# Patient Record
Sex: Male | Born: 1960 | Race: White | Hispanic: No | Marital: Married
Health system: Southern US, Community
[De-identification: ages and names within clinical notes are randomized; demographics above are authoritative.]

## PROBLEM LIST (undated history)

## (undated) DIAGNOSIS — E119 Type 2 diabetes mellitus without complications: Secondary | ICD-10-CM

## (undated) DIAGNOSIS — I10 Essential (primary) hypertension: Secondary | ICD-10-CM

## (undated) DIAGNOSIS — Z8619 Personal history of other infectious and parasitic diseases: Secondary | ICD-10-CM

## (undated) HISTORY — PX: HERNIA REPAIR: SHX51

---

## 2011-09-27 DIAGNOSIS — Z8619 Personal history of other infectious and parasitic diseases: Secondary | ICD-10-CM

## 2011-09-27 HISTORY — DX: Personal history of other infectious and parasitic diseases: Z86.19

## 2013-10-06 ENCOUNTER — Inpatient Hospital Stay (HOSPITAL_COMMUNITY)
Admission: EM | Admit: 2013-10-06 | Discharge: 2013-10-27 | DRG: 003 | Disposition: E | Payer: 59 | Attending: Internal Medicine | Admitting: Internal Medicine

## 2013-10-06 ENCOUNTER — Emergency Department (HOSPITAL_COMMUNITY): Payer: 59

## 2013-10-06 ENCOUNTER — Ambulatory Visit (HOSPITAL_COMMUNITY): Admit: 2013-10-06 | Payer: Self-pay | Admitting: Cardiology

## 2013-10-06 ENCOUNTER — Encounter (HOSPITAL_COMMUNITY): Admission: EM | Disposition: E | Payer: 59 | Source: Home / Self Care | Attending: Internal Medicine

## 2013-10-06 DIAGNOSIS — G4733 Obstructive sleep apnea (adult) (pediatric): Secondary | ICD-10-CM | POA: Diagnosis present

## 2013-10-06 DIAGNOSIS — I10 Essential (primary) hypertension: Secondary | ICD-10-CM | POA: Diagnosis present

## 2013-10-06 DIAGNOSIS — E662 Morbid (severe) obesity with alveolar hypoventilation: Secondary | ICD-10-CM | POA: Diagnosis present

## 2013-10-06 DIAGNOSIS — I469 Cardiac arrest, cause unspecified: Secondary | ICD-10-CM | POA: Diagnosis present

## 2013-10-06 DIAGNOSIS — E875 Hyperkalemia: Secondary | ICD-10-CM

## 2013-10-06 DIAGNOSIS — I4901 Ventricular fibrillation: Principal | ICD-10-CM | POA: Diagnosis present

## 2013-10-06 DIAGNOSIS — Z87891 Personal history of nicotine dependence: Secondary | ICD-10-CM

## 2013-10-06 DIAGNOSIS — R570 Cardiogenic shock: Secondary | ICD-10-CM | POA: Diagnosis present

## 2013-10-06 DIAGNOSIS — I501 Left ventricular failure: Secondary | ICD-10-CM | POA: Diagnosis present

## 2013-10-06 DIAGNOSIS — K029 Dental caries, unspecified: Secondary | ICD-10-CM | POA: Diagnosis present

## 2013-10-06 DIAGNOSIS — I213 ST elevation (STEMI) myocardial infarction of unspecified site: Secondary | ICD-10-CM

## 2013-10-06 DIAGNOSIS — E874 Mixed disorder of acid-base balance: Secondary | ICD-10-CM | POA: Diagnosis present

## 2013-10-06 DIAGNOSIS — I2510 Atherosclerotic heart disease of native coronary artery without angina pectoris: Secondary | ICD-10-CM | POA: Diagnosis present

## 2013-10-06 DIAGNOSIS — I498 Other specified cardiac arrhythmias: Secondary | ICD-10-CM | POA: Diagnosis present

## 2013-10-06 DIAGNOSIS — D638 Anemia in other chronic diseases classified elsewhere: Secondary | ICD-10-CM | POA: Diagnosis present

## 2013-10-06 DIAGNOSIS — E119 Type 2 diabetes mellitus without complications: Secondary | ICD-10-CM | POA: Diagnosis present

## 2013-10-06 DIAGNOSIS — I2109 ST elevation (STEMI) myocardial infarction involving other coronary artery of anterior wall: Secondary | ICD-10-CM | POA: Diagnosis present

## 2013-10-06 DIAGNOSIS — J9600 Acute respiratory failure, unspecified whether with hypoxia or hypercapnia: Secondary | ICD-10-CM | POA: Diagnosis present

## 2013-10-06 DIAGNOSIS — N19 Unspecified kidney failure: Secondary | ICD-10-CM

## 2013-10-06 DIAGNOSIS — Z6841 Body Mass Index (BMI) 40.0 and over, adult: Secondary | ICD-10-CM

## 2013-10-06 HISTORY — DX: Type 2 diabetes mellitus without complications: E11.9

## 2013-10-06 HISTORY — DX: Essential (primary) hypertension: I10

## 2013-10-06 HISTORY — PX: LEFT HEART CATHETERIZATION WITH CORONARY ANGIOGRAM: SHX5451

## 2013-10-06 HISTORY — DX: Personal history of other infectious and parasitic diseases: Z86.19

## 2013-10-06 LAB — COMPREHENSIVE METABOLIC PANEL
ALK PHOS: 116 U/L (ref 39–117)
ALT: 248 U/L — ABNORMAL HIGH (ref 0–53)
AST: 256 U/L — ABNORMAL HIGH (ref 0–37)
Albumin: 3 g/dL — ABNORMAL LOW (ref 3.5–5.2)
BUN: 78 mg/dL — AB (ref 6–23)
CHLORIDE: 102 meq/L (ref 96–112)
CO2: 12 meq/L — AB (ref 19–32)
CREATININE: 4.49 mg/dL — AB (ref 0.50–1.35)
Calcium: 9.6 mg/dL (ref 8.4–10.5)
GFR calc Af Amer: 16 mL/min — ABNORMAL LOW (ref >90)
GFR calc non Af Amer: 14 mL/min — ABNORMAL LOW (ref >90)
Glucose, Bld: 272 mg/dL — ABNORMAL HIGH (ref 70–99)
Potassium: 5.9 mEq/L — ABNORMAL HIGH (ref 3.7–5.3)
Sodium: 138 mEq/L (ref 137–147)
Total Protein: 6.5 g/dL (ref 6.0–8.3)

## 2013-10-06 LAB — CBC
HEMATOCRIT: 25.4 % — AB (ref 39.0–52.0)
Hemoglobin: 8.3 g/dL — ABNORMAL LOW (ref 13.0–17.0)
MCH: 30.4 pg (ref 26.0–34.0)
MCHC: 32.7 g/dL (ref 30.0–36.0)
MCV: 93 fL (ref 78.0–100.0)
Platelets: 286 10*3/uL (ref 150–400)
RBC: 2.73 MIL/uL — AB (ref 4.22–5.81)
RDW: 13.6 % (ref 11.5–15.5)
WBC: 20.1 10*3/uL — AB (ref 4.0–10.5)

## 2013-10-06 LAB — URINE MICROSCOPIC-ADD ON

## 2013-10-06 LAB — PROTIME-INR
INR: 1.69 — AB (ref 0.00–1.49)
PROTHROMBIN TIME: 19.4 s — AB (ref 11.6–15.2)

## 2013-10-06 LAB — POCT I-STAT, CHEM 8
BUN: 102 mg/dL — ABNORMAL HIGH (ref 6–23)
Calcium, Ion: 1.38 mmol/L — ABNORMAL HIGH (ref 1.12–1.23)
Chloride: 113 mEq/L — ABNORMAL HIGH (ref 96–112)
Creatinine, Ser: 4.9 mg/dL — ABNORMAL HIGH (ref 0.50–1.35)
GLUCOSE: 259 mg/dL — AB (ref 70–99)
HCT: 25 % — ABNORMAL LOW (ref 39.0–52.0)
Hemoglobin: 8.5 g/dL — ABNORMAL LOW (ref 13.0–17.0)
Potassium: 5.8 mEq/L — ABNORMAL HIGH (ref 3.7–5.3)
Sodium: 141 mEq/L (ref 137–147)
TCO2: 15 mmol/L (ref 0–100)

## 2013-10-06 LAB — POCT I-STAT TROPONIN I: Troponin i, poc: 0.56 ng/mL (ref 0.00–0.08)

## 2013-10-06 LAB — URINALYSIS, ROUTINE W REFLEX MICROSCOPIC
BILIRUBIN URINE: NEGATIVE
GLUCOSE, UA: 100 mg/dL — AB
Ketones, ur: NEGATIVE mg/dL
LEUKOCYTES UA: NEGATIVE
Nitrite: NEGATIVE
Specific Gravity, Urine: 1.014 (ref 1.005–1.030)
Urobilinogen, UA: 0.2 mg/dL (ref 0.0–1.0)
pH: 5 (ref 5.0–8.0)

## 2013-10-06 LAB — TROPONIN I: Troponin I: 0.57 ng/mL (ref <0.30)

## 2013-10-06 LAB — CG4 I-STAT (LACTIC ACID): Lactic Acid, Venous: 9.55 mmol/L — ABNORMAL HIGH (ref 0.5–2.2)

## 2013-10-06 SURGERY — LEFT HEART CATHETERIZATION WITH CORONARY ANGIOGRAM
Anesthesia: LOCAL

## 2013-10-06 MED ORDER — HEPARIN (PORCINE) IN NACL 2-0.9 UNIT/ML-% IJ SOLN
INTRAMUSCULAR | Status: AC
  Filled 2013-10-06: qty 1000

## 2013-10-06 MED ORDER — NOREPINEPHRINE BITARTRATE 1 MG/ML IJ SOLN
0.5000 ug/min | INTRAMUSCULAR | Status: DC
  Administered 2013-10-06: 5 ug/min via INTRAVENOUS
  Filled 2013-10-06: qty 4

## 2013-10-06 MED ORDER — BIVALIRUDIN 250 MG IV SOLR
INTRAVENOUS | Status: AC
  Filled 2013-10-06: qty 250

## 2013-10-06 MED ORDER — SODIUM CHLORIDE 0.9 % IV SOLN
1.0000 g | Freq: Once | INTRAVENOUS | Status: DC
  Filled 2013-10-06: qty 10

## 2013-10-06 MED ORDER — DEXTROSE 5 % IV SOLN
300.0000 mg | INTRAVENOUS | Status: AC | PRN
  Administered 2013-10-06: 300 mg via INTRAVENOUS

## 2013-10-06 MED ORDER — HEPARIN SODIUM (PORCINE) 5000 UNIT/ML IJ SOLN
INTRAMUSCULAR | Status: AC
  Filled 2013-10-06: qty 1

## 2013-10-06 MED ORDER — ETOMIDATE 2 MG/ML IV SOLN
INTRAVENOUS | Status: AC
  Filled 2013-10-06: qty 20

## 2013-10-06 MED ORDER — SODIUM BICARBONATE 8.4 % IV SOLN
50.0000 meq | Freq: Once | INTRAVENOUS | Status: AC
  Administered 2013-10-07: 50 meq via INTRAVENOUS

## 2013-10-06 MED ORDER — NOREPINEPHRINE BITARTRATE 1 MG/ML IJ SOLN
INTRAMUSCULAR | Status: AC | PRN
  Administered 2013-10-06: 2.093 ug/kg/min via INTRAVENOUS

## 2013-10-06 MED ORDER — LIDOCAINE HCL (PF) 1 % IJ SOLN
INTRAMUSCULAR | Status: AC
  Filled 2013-10-06: qty 30

## 2013-10-06 MED ORDER — EPINEPHRINE HCL 0.1 MG/ML IJ SOSY
PREFILLED_SYRINGE | INTRAMUSCULAR | Status: AC | PRN
  Administered 2013-10-06 (×8): 1 mg via INTRAVENOUS

## 2013-10-06 MED ORDER — LIDOCAINE HCL (CARDIAC) 20 MG/ML IV SOLN
INTRAVENOUS | Status: AC
  Filled 2013-10-06: qty 5

## 2013-10-06 MED ORDER — DEXTROSE 50 % IV SOLN: 50.0000 mL | Freq: Once | INTRAVENOUS | Status: DC

## 2013-10-06 MED ORDER — EPINEPHRINE HCL 1 MG/ML IJ SOLN
0.5000 ug/min | INTRAVENOUS | Status: DC
  Administered 2013-10-07: 0.267 ug/min via INTRAVENOUS
  Filled 2013-10-06: qty 4

## 2013-10-06 MED ORDER — NITROGLYCERIN 0.2 MG/ML ON CALL CATH LAB
INTRAVENOUS | Status: AC
  Filled 2013-10-06: qty 1

## 2013-10-06 MED ORDER — SUCCINYLCHOLINE CHLORIDE 20 MG/ML IJ SOLN
INTRAMUSCULAR | Status: DC
Start: 2013-10-06 — End: 2013-10-07
  Filled 2013-10-06: qty 1

## 2013-10-06 MED ORDER — ROCURONIUM BROMIDE 50 MG/5ML IV SOLN
INTRAVENOUS | Status: AC
  Filled 2013-10-06: qty 2

## 2013-10-06 MED ORDER — SODIUM CHLORIDE 0.9 % IV SOLN
1.0000 ug/kg/min | INTRAVENOUS | Status: DC
  Administered 2013-10-07: 1 ug/kg/min via INTRAVENOUS
  Filled 2013-10-06: qty 20

## 2013-10-06 MED ORDER — EPINEPHRINE HCL 0.1 MG/ML IJ SOSY
PREFILLED_SYRINGE | INTRAMUSCULAR | Status: AC
  Filled 2013-10-06: qty 10

## 2013-10-06 MED ORDER — PHENYLEPHRINE HCL 10 MG/ML IJ SOLN
INTRAMUSCULAR | Status: AC
  Filled 2013-10-06: qty 1

## 2013-10-06 MED ORDER — ASPIRIN 600 MG RE SUPP: 600.0000 mg | Freq: Once | RECTAL | Status: DC

## 2013-10-06 MED ORDER — INSULIN ASPART 100 UNIT/ML ~~LOC~~ SOLN: 8.0000 [IU] | Freq: Once | SUBCUTANEOUS | Status: DC

## 2013-10-06 MED ORDER — CALCIUM CHLORIDE 10 % IV SOLN
INTRAVENOUS | Status: AC | PRN
  Administered 2013-10-06 (×2): 1 g via INTRAVENOUS

## 2013-10-06 MED ORDER — SODIUM BICARBONATE 8.4 % IV SOLN
INTRAVENOUS | Status: AC | PRN
  Administered 2013-10-06 (×3): 50 meq via INTRAVENOUS

## 2013-10-06 MED ORDER — SODIUM CHLORIDE 0.9 % IV SOLN
25.0000 ug/h | INTRAVENOUS | Status: DC
  Administered 2013-10-06: 25 ug/h via INTRAVENOUS
  Filled 2013-10-06: qty 50

## 2013-10-06 MED ORDER — SODIUM CHLORIDE 0.9 % IV SOLN
1.0000 mg/h | INTRAVENOUS | Status: DC
  Administered 2013-10-06: 1 mg/h via INTRAVENOUS
  Filled 2013-10-06: qty 10

## 2013-10-06 MED ORDER — ATROPINE SULFATE 1 MG/ML IJ SOLN
INTRAMUSCULAR | Status: AC | PRN
  Administered 2013-10-06 (×3): 1 mg via INTRAVENOUS

## 2013-10-06 MED ORDER — SODIUM CHLORIDE 0.9 % IV SOLN: 2000.0000 mL | Freq: Once | INTRAVENOUS | Status: DC

## 2013-10-06 NOTE — ED Notes (Signed)
Dr. Denton Lank into room

## 2013-10-06 NOTE — ED Notes (Signed)
Dr. Gordy Levan into room.

## 2013-10-06 NOTE — Code Documentation (Signed)
Levophed increased to 78mcc/min per Dr. Sharyn Lull (card) at [facility]. Dr. Jacklynn Bue (anesth) at [facility].

## 2013-10-06 NOTE — Code Documentation (Signed)
Dr. Sharyn Lull at [facility], heparin 4000 units ordered.

## 2013-10-06 NOTE — Code Documentation (Signed)
Cath lab aborted temporarily, pt back into room, no obvious changes, Drs Gordy Levan and Denton Lank present, Dr. Sharyn Lull aware, pending arrival of anesthesia.

## 2013-10-06 NOTE — Code Documentation (Signed)
Pulses remain, LSB removed, CPR board placed.

## 2013-10-06 NOTE — Progress Notes (Signed)
Pt still has Brooke Dare airway present from EMS. ED physicians, cardiology MD and Anesthesia agreed to transport pt to cath lab on vent. End-tidal CO2 maintaining around 30 while ventilated. Adequate VT through [facility] airway.

## 2013-10-06 NOTE — Code Documentation (Signed)
Going to cath lab now

## 2013-10-06 NOTE — Code Documentation (Addendum)
CPR continues, kiing airway in place, bagged. Pulse rhythm check, no shock advised, no pulse, CPR continues.

## 2013-10-06 NOTE — ED Notes (Signed)
Hypothermia Protocol activated per Al Corpus RN appx 21:20

## 2013-10-06 NOTE — Code Documentation (Addendum)
Dr. Gordy Levan & Dr. Denton Lank at [facility], [place] check, no cardiac motion, course vfib shocked, 150j

## 2013-10-06 NOTE — Code Documentation (Signed)
Pulse check, no pulses, Vf, no shock advised. CPR continues.

## 2013-10-06 NOTE — Code Documentation (Signed)
Pulse returned

## 2013-10-06 NOTE — Code Documentation (Signed)
Pulse check, pulse present, HR 68, narrow complex, lab at [facility], EKG initiated.

## 2013-10-06 NOTE — Code Documentation (Signed)
Heparin given

## 2013-10-06 NOTE — Code Documentation (Signed)
Cath lab ready, preparing to intubate, change king airway to ETT. Drs. Steinl and [place] at [facility].

## 2013-10-06 NOTE — Code Documentation (Signed)
CPR continues.  

## 2013-10-06 NOTE — Code Documentation (Signed)
CPR started.

## 2013-10-06 NOTE — ED Notes (Signed)
Arrives s/p CPR, arrives paced, 60 BPM, , bagged with king airway in place, I/O R tib, pulses present with paced, wife out front, here from airport, off of airplace after boarding, CPR for toatal of 35 minutes, 6 epi given, cold saline initiated, no shock advised on airport AED, arrives on LSB with c-collar, 1st rhythm was PEA.

## 2013-10-06 NOTE — Code Documentation (Signed)
Blood being drawn.

## 2013-10-06 NOTE — Code Documentation (Signed)
Fentanyl, nimbex and versed stopped, CPR continues.

## 2013-10-06 NOTE — Code Documentation (Signed)
Faint pulse remains

## 2013-10-06 NOTE — ED Notes (Addendum)
CPR re-started. No pulses.

## 2013-10-06 NOTE — Code Documentation (Signed)
CPR continued, no pulses.

## 2013-10-06 NOTE — Code Documentation (Signed)
Rate change levophed 17mcg/min

## 2013-10-06 NOTE — ED Provider Notes (Signed)
CSN: [id number]     Arrival date & time 10-19-13  2106 History   First MD Initiated Contact with Patient [id number]     No chief complaint on file.  (Consider location/radiation/quality/duration/timing/severity/associated sxs/prior Treatment) HPI Comments: Cardiac arrest on commercial flight. PTA, CPR for 35+ minutes, 6 rounds epi given, ROS achieved eventually. Patient being paced upon arrival at 60 BPM.   The history is provided by the EMS personnel. The history is limited by the absence of a caregiver.    No past medical history on file. No past surgical history on file. No family history on file. History  Substance Use Topics  . Smoking status: Not on file  . Smokeless tobacco: Not on file  . Alcohol Use: Not on file    Review of Systems  Unable to perform ROS: Patient unresponsive    Allergies  Review of patient's allergies indicates not on file.  Home Medications  No current outpatient prescriptions on file. BP 77/51  Pulse 117  Resp 21  Ht 6' (1.829 m)  Wt 330 lb (149.687 kg)  BMI 44.75 kg/m2  SpO2 80% Physical Exam  Constitutional: He appears well-developed and well-nourished. He is uncooperative. He appears toxic. He is intubated.  HENT:  Head: Normocephalic and atraumatic.  Mouth/Throat: Abnormal dentition. Dental caries present.  Tongue edematous and swollen  Eyes: Right pupil is round. Left pupil is round.  Neck: Normal range of motion present.  Cardiovascular: Bradycardia present.   Pulses:      Femoral pulses are 0 on the right side, and 0 on the left side. Pulmonary/Chest: He is intubated.  Abdominal: Soft. He exhibits no distension.  Neurological: GCS eye subscore is 1. GCS verbal subscore is 1. GCS motor subscore is 1.    ED Course  Procedures (including critical care time) Labs Review Labs Reviewed  TROPONIN I - Abnormal; Notable for the following:    Troponin I 0.57 (*)    All other components within normal limits  CBC - Abnormal;  Notable for the following:    WBC 20.1 (*)    RBC 2.73 (*)    Hemoglobin 8.3 (*)    HCT 25.4 (*)    All other components within normal limits  COMPREHENSIVE METABOLIC PANEL - Abnormal; Notable for the following:    Potassium 5.9 (*)    CO2 12 (*)    Glucose, Bld 272 (*)    BUN 78 (*)    Creatinine, Ser 4.49 (*)    Albumin 3.0 (*)    AST 256 (*)    ALT 248 (*)    Total Bilirubin <0.2 (*)    GFR calc non Af Amer 14 (*)    GFR calc Af Amer 16 (*)    All other components within normal limits  PROTIME-INR - Abnormal; Notable for the following:    Prothrombin Time 19.4 (*)    INR 1.69 (*)    All other components within normal limits  URINALYSIS, ROUTINE W REFLEX MICROSCOPIC - Abnormal; Notable for the following:    APPearance CLOUDY (*)    Glucose, UA 100 (*)    Hgb urine dipstick MODERATE (*)    Protein, ur >300 (*)    All other components within normal limits  URINE MICROSCOPIC-ADD ON - Abnormal; Notable for the following:    Squamous Epithelial / LPF FEW (*)    Bacteria, UA FEW (*)    All other components within normal limits  POCT I-STAT, CHEM 8 - Abnormal; Notable  for the following:    Potassium 5.8 (*)    Chloride 113 (*)    BUN 102 (*)    Creatinine, Ser 4.90 (*)    Glucose, Bld 259 (*)    Calcium, Ion 1.38 (*)    Hemoglobin 8.5 (*)    HCT 25.0 (*)    All other components within normal limits  CG4 I-STAT (LACTIC ACID) - Abnormal; Notable for the following:    Lactic Acid, Venous 9.55 (*)    All other components within normal limits  POCT I-STAT TROPONIN I - Abnormal; Notable for the following:    Troponin i, poc 0.56 (*)    All other components within normal limits  BLOOD GAS, ARTERIAL   Imaging Review Dg Chest Port 1 View  10/05/2013   CLINICAL DATA:  Shortness of breath. Post cardiorespiratory arrest with resuscitation.  EXAM: PORTABLE CHEST - 1 VIEW  COMPARISON:  None.  FINDINGS: The entire left side of the chest was not included on the image. External pacing  pads. Cardiac silhouette enlarged. Diffuse interstitial and perihilar airspace pulmonary edema bilaterally.  IMPRESSION: Cardiomegaly with interstitial and perihilar airspace pulmonary edema.   Electronically Signed   By: Hulan Saas M.D.   On: 10/12/2013 22:10    EKG Interpretation    Date/Time:    Ventricular Rate:    PR Interval:    QRS Duration:   QT Interval:    QTC Calculation:   R Axis:     Text Interpretation:              MDM  1. Cardiac arrest  53 yo M with no known PMHx arrives as CPR progressed earlier, with ROSC.   Upon arrival, patient pulseless with course vfib. CPR started, epi given, no shock initially advised. Good compressions maintained throughout. King airway in place with good etCO2. Patient went into shockable rhythm, shocked x1, with ROSC, bradycardia, appreciable palpable femoral pulses. CPR stopped. Atropine given, epi given, with improvement in heart rate, blood pressures.   BPs dropped, HR dropped multiple times, continued CPR, with imprvoement after atropine and epi. Bicarb, calcium given. EKG performed, with concern for STEMI. Cardiologist contacted urgently. Cath lab prepped.   Patient with [facility] airway in place, not definitive, but able to ventilate with mechanical ventilation. Given swollen tongue, will hold on switching out airway. Anesthesia called prior to transport, and agree that switching out with this patient would be dangerous. Definitive airway would likely be a trach.   Patient transported to cath lab in dangerously critical condition. Unlikely survival given CPR time. Patient seen and evaluated by myself and my attending, Dr. Denton Lank.      Imagene Sheller, MD 10-27-2013 [phone]

## 2013-10-07 ENCOUNTER — Encounter (HOSPITAL_COMMUNITY): Payer: Self-pay | Admitting: Internal Medicine

## 2013-10-07 ENCOUNTER — Encounter (HOSPITAL_COMMUNITY): Admission: EM | Disposition: E | Payer: Self-pay | Source: Home / Self Care | Attending: Internal Medicine

## 2013-10-07 ENCOUNTER — Encounter (HOSPITAL_COMMUNITY): Payer: 59 | Admitting: Anesthesiology

## 2013-10-07 ENCOUNTER — Inpatient Hospital Stay (HOSPITAL_COMMUNITY): Payer: 59 | Admitting: Anesthesiology

## 2013-10-07 DIAGNOSIS — I469 Cardiac arrest, cause unspecified: Secondary | ICD-10-CM | POA: Diagnosis present

## 2013-10-07 HISTORY — PX: TRACHEOSTOMY TUBE PLACEMENT: SHX814

## 2013-10-07 LAB — POCT I-STAT 3, ART BLOOD GAS (G3+)
ACID-BASE DEFICIT: 13 mmol/L — AB (ref 0.0–2.0)
ACID-BASE DEFICIT: 16 mmol/L — AB (ref 0.0–2.0)
Acid-base deficit: 14 mmol/L — ABNORMAL HIGH (ref 0.0–2.0)
BICARBONATE: 15.5 meq/L — AB (ref 20.0–24.0)
BICARBONATE: 17.2 meq/L — AB (ref 20.0–24.0)
Bicarbonate: 14.3 mEq/L — ABNORMAL LOW (ref 20.0–24.0)
O2 SAT: 68 %
O2 Saturation: 77 %
O2 Saturation: 79 %
TCO2: 17 mmol/L (ref 0–100)
TCO2: 16 mmol/L (ref 0–100)
TCO2: 19 mmol/L (ref 0–100)
pCO2 arterial: 53.8 mmHg — ABNORMAL HIGH (ref 35.0–45.0)
pCO2 arterial: 52.8 mmHg — ABNORMAL HIGH (ref 35.0–45.0)
pCO2 arterial: 63.4 mmHg (ref 35.0–45.0)
pH, Arterial: 7.067 — CL (ref 7.350–7.450)
pH, Arterial: 7.027 — CL (ref 7.350–7.450)
pH, Arterial: 7.028 — CL (ref 7.350–7.450)
pO2, Arterial: 49 mmHg — ABNORMAL LOW (ref 80.0–100.0)
pO2, Arterial: 55 mmHg — ABNORMAL LOW (ref 80.0–100.0)
pO2, Arterial: 57 mmHg — ABNORMAL LOW (ref 80.0–100.0)

## 2013-10-07 LAB — POCT I-STAT, CHEM 8
BUN: 93 mg/dL — ABNORMAL HIGH (ref 6–23)
BUN: 97 mg/dL — ABNORMAL HIGH (ref 6–23)
CHLORIDE: 109 meq/L (ref 96–112)
CREATININE: 4.3 mg/dL — AB (ref 0.50–1.35)
CREATININE: 4.5 mg/dL — AB (ref 0.50–1.35)
Calcium, Ion: 1.25 mmol/L — ABNORMAL HIGH (ref 1.12–1.23)
Calcium, Ion: 1.36 mmol/L — ABNORMAL HIGH (ref 1.12–1.23)
Chloride: 110 mEq/L (ref 96–112)
GLUCOSE: 264 mg/dL — AB (ref 70–99)
GLUCOSE: 340 mg/dL — AB (ref 70–99)
HCT: 25 % — ABNORMAL LOW (ref 39.0–52.0)
HEMATOCRIT: 25 % — AB (ref 39.0–52.0)
HEMOGLOBIN: 8.5 g/dL — AB (ref 13.0–17.0)
Hemoglobin: 8.5 g/dL — ABNORMAL LOW (ref 13.0–17.0)
POTASSIUM: 5.5 meq/L — AB (ref 3.7–5.3)
Potassium: 4.8 mEq/L (ref 3.7–5.3)
SODIUM: 144 meq/L (ref 137–147)
Sodium: 142 mEq/L (ref 137–147)
TCO2: 17 mmol/L (ref 0–100)
TCO2: 18 mmol/L (ref 0–100)

## 2013-10-07 LAB — POCT ACTIVATED CLOTTING TIME: Activated Clotting Time: 409 seconds

## 2013-10-07 LAB — LACTIC ACID, PLASMA: Lactic Acid, Venous: 7.3 mmol/L — ABNORMAL HIGH (ref 0.5–2.2)

## 2013-10-07 LAB — PROTIME-INR
INR: 5.09 — AB (ref 0.00–1.49)
Prothrombin Time: 45 seconds — ABNORMAL HIGH (ref 11.6–15.2)

## 2013-10-07 LAB — TROPONIN I: Troponin I: 3.92 ng/mL (ref <0.30)

## 2013-10-07 LAB — APTT: aPTT: >200 seconds (ref 24–37)

## 2013-10-07 LAB — GLUCOSE, CAPILLARY: Glucose-Capillary: 328 mg/dL — ABNORMAL HIGH (ref 70–99)

## 2013-10-07 LAB — MRSA PCR SCREENING: MRSA by PCR: POSITIVE — AB

## 2013-10-07 SURGERY — CREATION, TRACHEOSTOMY
Anesthesia: General | Site: Neck

## 2013-10-07 MED ORDER — EPINEPHRINE HCL 0.1 MG/ML IJ SOSY
PREFILLED_SYRINGE | INTRAMUSCULAR | Status: AC
  Filled 2013-10-07: qty 10

## 2013-10-07 MED ORDER — AMIODARONE HCL IN DEXTROSE 360-4.14 MG/200ML-% IV SOLN
INTRAVENOUS | Status: DC | PRN
  Administered 2013-10-07: 60 mg/h via INTRAVENOUS

## 2013-10-07 MED ORDER — SODIUM CHLORIDE 0.9 % IV SOLN
50.0000 mg | INTRAVENOUS | Status: DC | PRN
  Administered 2013-10-07: 1 mg/h via INTRAVENOUS

## 2013-10-07 MED ORDER — ARTIFICIAL TEARS OP OINT
TOPICAL_OINTMENT | OPHTHALMIC | Status: DC | PRN
  Administered 2013-10-07: 1 via OPHTHALMIC

## 2013-10-07 MED ORDER — EPINEPHRINE HCL 1 MG/ML IJ SOLN
1000.0000 ug | INTRAVENOUS | Status: DC | PRN
  Administered 2013-10-07: 5 ug/min via INTRAVENOUS

## 2013-10-07 MED ORDER — ASPIRIN 81 MG PO CHEW: 324.0000 mg | CHEWABLE_TABLET | ORAL | Status: DC

## 2013-10-07 MED ORDER — ATORVASTATIN CALCIUM 80 MG PO TABS
80.0000 mg | ORAL_TABLET | Freq: Every day | ORAL | Status: DC
  Filled 2013-10-07: qty 1

## 2013-10-07 MED ORDER — ARTIFICIAL TEARS OP OINT: 1.0000 "application " | TOPICAL_OINTMENT | Freq: Three times a day (TID) | OPHTHALMIC | Status: DC

## 2013-10-07 MED ORDER — AMIODARONE LOAD VIA INFUSION: 150.0000 mg | Freq: Once | INTRAVENOUS | Status: DC

## 2013-10-07 MED ORDER — ATROPINE SULFATE 0.1 MG/ML IJ SOLN
INTRAMUSCULAR | Status: AC
  Filled 2013-10-07: qty 10

## 2013-10-07 MED ORDER — NITROGLYCERIN 0.4 MG SL SUBL: 0.4000 mg | SUBLINGUAL_TABLET | SUBLINGUAL | Status: DC | PRN

## 2013-10-07 MED ORDER — SODIUM BICARBONATE 8.4 % IV SOLN: 100.0000 meq | Freq: Once | INTRAVENOUS | Status: DC

## 2013-10-07 MED ORDER — FENTANYL BOLUS VIA INFUSION
50.0000 ug | INTRAVENOUS | Status: DC | PRN
  Filled 2013-10-07: qty 50

## 2013-10-07 MED ORDER — PANTOPRAZOLE SODIUM 40 MG IV SOLR: 40.0000 mg | INTRAVENOUS | Status: DC

## 2013-10-07 MED ORDER — SODIUM BICARBONATE 8.4 % IV SOLN
INTRAVENOUS | Status: AC
  Filled 2013-10-07: qty 100

## 2013-10-07 MED ORDER — TIROFIBAN HCL IV 5 MG/100ML
INTRAVENOUS | Status: AC
  Filled 2013-10-07: qty 100

## 2013-10-07 MED ORDER — AMIODARONE LOAD VIA INFUSION
150.0000 mg | Freq: Once | INTRAVENOUS | Status: AC
  Administered 2013-10-07: 150 mg via INTRAVENOUS
  Filled 2013-10-07: qty 83.34

## 2013-10-07 MED ORDER — LIDOCAINE-EPINEPHRINE 1 %-1:100000 IJ SOLN
INTRAMUSCULAR | Status: AC
  Filled 2013-10-07: qty 1

## 2013-10-07 MED ORDER — AMIODARONE HCL IN DEXTROSE 360-4.14 MG/200ML-% IV SOLN: 30.0000 mg/h | INTRAVENOUS | Status: DC

## 2013-10-07 MED ORDER — SODIUM BICARBONATE 8.4 % IV SOLN
INTRAVENOUS | Status: DC | PRN
  Administered 2013-10-07 (×3): 50 meq via INTRAVENOUS

## 2013-10-07 MED ORDER — SODIUM BICARBONATE 8.4 % IV SOLN
INTRAVENOUS | Status: AC
  Filled 2013-10-07: qty 200

## 2013-10-07 MED ORDER — CALCIUM CHLORIDE 10 % IV SOLN
INTRAVENOUS | Status: AC
  Filled 2013-10-07: qty 10

## 2013-10-07 MED ORDER — SODIUM BICARBONATE 4.2 % IV SOLN: 100.0000 meq | Freq: Once | INTRAVENOUS | Status: DC

## 2013-10-07 MED ORDER — EPINEPHRINE HCL 0.1 MG/ML IJ SOSY
PREFILLED_SYRINGE | INTRAMUSCULAR | Status: AC
Start: 2013-10-07 — End: 2013-10-07
  Filled 2013-10-07: qty 10

## 2013-10-07 MED ORDER — MIDAZOLAM HCL 2 MG/2ML IJ SOLN: 2.0000 mg | Freq: Once | INTRAMUSCULAR | Status: DC | PRN

## 2013-10-07 MED ORDER — ACETAMINOPHEN 325 MG PO TABS: 650.0000 mg | ORAL_TABLET | ORAL | Status: DC | PRN

## 2013-10-07 MED ORDER — INSULIN ASPART 100 UNIT/ML ~~LOC~~ SOLN: 0.0000 [IU] | Freq: Three times a day (TID) | SUBCUTANEOUS | Status: DC

## 2013-10-07 MED ORDER — AMIODARONE HCL IN DEXTROSE 360-4.14 MG/200ML-% IV SOLN
60.0000 mg/h | INTRAVENOUS | Status: AC
  Administered 2013-10-07: 60 mg/h via INTRAVENOUS
  Filled 2013-10-07: qty 200

## 2013-10-07 MED ORDER — SODIUM CHLORIDE 0.9 % IV SOLN
2500.0000 ug | INTRAVENOUS | Status: DC | PRN
  Administered 2013-10-07: 12.5 ug/h via INTRAVENOUS

## 2013-10-07 MED ORDER — SODIUM CHLORIDE 0.9 % IV SOLN: 2000.0000 mL | Freq: Once | INTRAVENOUS | Status: DC

## 2013-10-07 MED ORDER — EPINEPHRINE HCL 1 MG/ML IJ SOLN
INTRAMUSCULAR | Status: DC | PRN
  Administered 2013-10-07 (×2): 1 mg via INTRAVENOUS

## 2013-10-07 MED ORDER — AMIODARONE HCL IN DEXTROSE 360-4.14 MG/200ML-% IV SOLN: 60.0000 mg/h | INTRAVENOUS | Status: DC

## 2013-10-07 MED ORDER — NOREPINEPHRINE BITARTRATE 1 MG/ML IJ SOLN
0.5000 ug/min | INTRAVENOUS | Status: DC
  Administered 2013-10-07: 100 ug/min via INTRAVENOUS
  Filled 2013-10-07 (×3): qty 16

## 2013-10-07 MED ORDER — LIDOCAINE-EPINEPHRINE 1 %-1:100000 IJ SOLN
INTRAMUSCULAR | Status: DC | PRN
  Administered 2013-10-07: 7.5 mL

## 2013-10-07 MED ORDER — ASPIRIN EC 81 MG PO TBEC: 81.0000 mg | DELAYED_RELEASE_TABLET | Freq: Every day | ORAL | Status: DC

## 2013-10-07 MED ORDER — TICAGRELOR 90 MG PO TABS
90.0000 mg | ORAL_TABLET | Freq: Two times a day (BID) | ORAL | Status: DC
  Filled 2013-10-07 (×2): qty 1

## 2013-10-07 MED ORDER — NOREPINEPHRINE BITARTRATE 1 MG/ML IJ SOLN
4000.0000 ug | INTRAVENOUS | Status: DC | PRN
  Administered 2013-10-07: 200 ug/min via INTRAVENOUS

## 2013-10-07 MED ORDER — ATROPINE SULFATE 1 MG/ML IJ SOLN
INTRAMUSCULAR | Status: DC | PRN
  Administered 2013-10-07 (×3): 1 mg via INTRAVENOUS

## 2013-10-07 MED ORDER — TIROFIBAN HCL IV 5 MG/100ML
0.0750 ug/kg/min | INTRAVENOUS | Status: DC
  Filled 2013-10-07: qty 100

## 2013-10-07 MED ORDER — SODIUM CHLORIDE 0.9 % IV SOLN: 1.0000 mg/h | INTRAVENOUS | Status: DC

## 2013-10-07 MED ORDER — MIDAZOLAM BOLUS VIA INFUSION
2.0000 mg | INTRAVENOUS | Status: DC | PRN
  Filled 2013-10-07: qty 2

## 2013-10-07 MED ORDER — 0.9 % SODIUM CHLORIDE (POUR BTL) OPTIME
TOPICAL | Status: DC | PRN
  Administered 2013-10-07: 1000 mL

## 2013-10-07 MED ORDER — INSULIN ASPART 100 UNIT/ML ~~LOC~~ SOLN
15.0000 [IU] | Freq: Once | SUBCUTANEOUS | Status: AC
  Administered 2013-10-07: 15 [IU] via SUBCUTANEOUS

## 2013-10-07 MED ORDER — FENTANYL CITRATE 0.05 MG/ML IJ SOLN: 100.0000 ug | Freq: Once | INTRAMUSCULAR | Status: DC

## 2013-10-07 MED ORDER — ONDANSETRON HCL 4 MG/2ML IJ SOLN: 4.0000 mg | Freq: Four times a day (QID) | INTRAMUSCULAR | Status: DC | PRN

## 2013-10-07 MED ORDER — HEPARIN (PORCINE) IN NACL 100-0.45 UNIT/ML-% IJ SOLN
900.0000 [IU]/h | INTRAMUSCULAR | Status: DC
  Filled 2013-10-07: qty 250

## 2013-10-07 MED ORDER — ASPIRIN 300 MG RE SUPP
300.0000 mg | RECTAL | Status: DC
  Filled 2013-10-07: qty 1

## 2013-10-07 MED ORDER — HEPARIN SODIUM (PORCINE) 5000 UNIT/ML IJ SOLN: 5000.0000 [IU] | Freq: Three times a day (TID) | INTRAMUSCULAR | Status: DC

## 2013-10-07 MED ORDER — AMIODARONE HCL IN DEXTROSE 360-4.14 MG/200ML-% IV SOLN
30.0000 mg/h | INTRAVENOUS | Status: DC
  Filled 2013-10-07 (×2): qty 200

## 2013-10-07 MED ORDER — FUROSEMIDE 10 MG/ML IJ SOLN: 40.0000 mg | Freq: Every day | INTRAMUSCULAR | Status: DC

## 2013-10-07 MED FILL — Sodium Chloride IV Soln 0.9%: INTRAVENOUS | Qty: 50 | Status: AC

## 2013-10-07 MED FILL — Medication: Qty: 1 | Status: AC

## 2013-10-07 SURGICAL SUPPLY — 40 items
BLADE SURG 15 STRL LF DISP TIS (BLADE) ×1 IMPLANT
BLADE SURG 15 STRL SS (BLADE) ×2
BLADE SURG ROTATE 9660 (MISCELLANEOUS) IMPLANT
CANISTER SUCTION 2500CC (MISCELLANEOUS) ×3 IMPLANT
CLEANER TIP ELECTROSURG 2X2 (MISCELLANEOUS) ×3 IMPLANT
CLOTH BEACON ORANGE TIMEOUT ST (SAFETY) IMPLANT
COVER SURGICAL LIGHT HANDLE (MISCELLANEOUS) ×3 IMPLANT
CRADLE DONUT ADULT HEAD (MISCELLANEOUS) IMPLANT
DECANTER SPIKE VIAL GLASS SM (MISCELLANEOUS) ×3 IMPLANT
ELECT COATED BLADE 2.86 ST (ELECTRODE) ×3 IMPLANT
ELECT REM PT RETURN 9FT ADLT (ELECTROSURGICAL) ×3
ELECTRODE REM PT RTRN 9FT ADLT (ELECTROSURGICAL) ×1 IMPLANT
GAUZE SPONGE 4X4 16PLY XRAY LF (GAUZE/BANDAGES/DRESSINGS) IMPLANT
GLOVE BIO SURGEON STRL SZ7.5 (GLOVE) ×3 IMPLANT
GOWN STRL NON-REIN LRG LVL3 (GOWN DISPOSABLE) ×6 IMPLANT
HOLDER TRACH TUBE VELCRO 19.5 (MISCELLANEOUS) ×3 IMPLANT
KIT BASIN OR (CUSTOM PROCEDURE TRAY) ×3 IMPLANT
KIT ROOM TURNOVER OR (KITS) ×3 IMPLANT
KIT SUCTION CATH 14FR (SUCTIONS) IMPLANT
NEEDLE HYPO 25GX1X1/2 BEV (NEEDLE) ×3 IMPLANT
NS IRRIG 1000ML POUR BTL (IV SOLUTION) ×3 IMPLANT
PACK EENT II TURBAN DRAPE (CUSTOM PROCEDURE TRAY) ×3 IMPLANT
PAD ARMBOARD 7.5X6 YLW CONV (MISCELLANEOUS) ×6 IMPLANT
PENCIL BUTTON HOLSTER BLD 10FT (ELECTRODE) ×3 IMPLANT
SPONGE DRAIN TRACH 4X4 STRL 2S (GAUZE/BANDAGES/DRESSINGS) ×3 IMPLANT
SPONGE INTESTINAL PEANUT (DISPOSABLE) ×3 IMPLANT
SUT MON AB 3-0 SH 27 (SUTURE) ×2
SUT MON AB 3-0 SH27 (SUTURE) ×1 IMPLANT
SUT SILK 0 FSL (SUTURE) ×3 IMPLANT
SUT SILK 2 0 REEL (SUTURE) ×3 IMPLANT
SUT SILK 2 0 SH CR/8 (SUTURE) ×3 IMPLANT
SUT VIC AB 2-0 FS1 27 (SUTURE) IMPLANT
SYR 20ML ECCENTRIC (SYRINGE) ×3 IMPLANT
SYR BULB 3OZ (MISCELLANEOUS) IMPLANT
SYR CONTROL 10ML LL (SYRINGE) ×3 IMPLANT
TOWEL OR 17X24 6PK STRL BLUE (TOWEL DISPOSABLE) ×3 IMPLANT
TOWEL OR 17X26 10 PK STRL BLUE (TOWEL DISPOSABLE) ×3 IMPLANT
TUBE CONNECTING 12'X1/4 (SUCTIONS)
TUBE CONNECTING 12X1/4 (SUCTIONS) IMPLANT
WATER STERILE IRR 1000ML POUR (IV SOLUTION) ×3 IMPLANT

## 2013-10-08 LAB — POCT I-STAT 3, ART BLOOD GAS (G3+)
Acid-base deficit: 14 mmol/L — ABNORMAL HIGH (ref 0.0–2.0)
Bicarbonate: 14.4 mEq/L — ABNORMAL LOW (ref 20.0–24.0)
O2 Saturation: 78 %
PO2 ART: 51 mmHg — AB (ref 80.0–100.0)
Patient temperature: 95
TCO2: 16 mmol/L (ref 0–100)
pCO2 arterial: 44.2 mmHg (ref 35.0–45.0)
pH, Arterial: 7.107 — CL (ref 7.350–7.450)

## 2013-10-08 NOTE — ED Provider Notes (Signed)
I saw and evaluated the patient, reviewed the resident's note and I agree with the findings and plan.  ECG below interpreted by me.  Date: 10/08/2013  Rate: 56  Rhythm: sinus bradycardia  QRS Axis: left  Intervals: PR prolonged  ST/T Wave abnormalities: ST elevations anteriorly  Conduction Disutrbances:nonspecific intraventricular conduction delay  Narrative Interpretation:   Old EKG Reviewed: none available Mild st elev/t changes in v1 and v2 c/w stemi, elev avr  Pt arrived via ems cpr in progress - level 5 caveat.  By report, pt on commercial flight, when c/o cp and dyspnea, collapsed, lost pulse/breathing, cpr, emergency landing.  ems met at airport, continued cpr, bag vent. Ems notes their efforts ongoing approximately 30-40 minutes. They found pt apneic, and pulseless, w cpr in progress, no signs of life, initial rhythm PEA.  They placed [facility] airway, and note that in route brief period w pulses and that pt remained unresponsive and apneic.   In ED, cpr. Additional rounds epi. Initial rhythm fine PEA vs asystole.  cpr continued, bag ventilated. Additional iv access (IO line by ems).    Recheck rhythm, ?v fib, defib x 1.  Pt w brady, wide complex/irregular rhythm, w pulse.  Cacl, bicarb, atropine.   Pt with sinus rhythm, pulses, bp.  ecg done, stemi - stemi paged, discussed w cardiologist, will take to cath lab, critical care team paged. Still shows to response/movement. Pupils remain fixed and dilated.  Apneic. Bag ventilated. Asa/rectal.   Family notified of condition.   While awaiting cath team/cardiologist - numerous periods of loss of rhythm, pulses. cpr reinstituted as needed. For brady, additional atropine. Additional epi.  istat returned, k high, renal failure.  Additional cacl iv. Bicarb, d5o insulin, ordered.   Return of rhythm, pulses.   bp low despite wide open fluids. Levophed gtt, titrated.   Pt with recurrent cycle brady, loss of rhythm/pulses, cpr up until taken  to cath lab. Anesthesia also paged in anticipation of eventual change of King airway to definitive ETT vs trach, in pt w v difficult airway.   Decision made to take emergently to cath leave and leave [facility] airway in place, as [facility] airway was providing ventilation, bilateral breath sounds, etco2 monitoring.  In addition, on assessing airway, pt morbidly obese, with markedly swollen tongue (no arrival to ED), extremely limited room/view of oropharynx.  It was felt that if pt survived cath, tube could most safely be change in controlled setting of OR or ICU, w additional specialty care back up.   When taking to cath lab, pt w rhythm, pulses, being ventilated by [facility] airway, w bilateral bs, max dose on levophed gtt, ivf, in extreme critical condition.    CRITICAL CARE  Re cardioresp arrest, prolonged/recurrent cpr, stemi, hypotension, bradycardia,  Performed by: Mirna Mires Total critical care time: 40 Critical care time was exclusive of separately billable procedures and treating other patients. Critical care was necessary to treat or prevent imminent or life-threatening deterioration. Critical care was time spent personally by me on the following activities: development of treatment plan with patient and/or surrogate as well as nursing, discussions with consultants, evaluation of patient's response to treatment, examination of patient, obtaining history from patient or surrogate, ordering and performing treatments and interventions, ordering and review of laboratory studies, ordering and review of radiographic studies, pulse oximetry and re-evaluation of patient's condition.   Mirna Mires, MD 10/08/13 1146

## 2013-10-10 ENCOUNTER — Encounter (HOSPITAL_COMMUNITY): Payer: Self-pay | Admitting: Otolaryngology

## 2013-10-27 NOTE — Progress Notes (Signed)
PATIENT NAME: Edward Dyer MEDICAL RECORD NUMBER: [id number] Birthday: November 19, 1960  Age: 53 y.o. Admit Date: 09/26/2013  Indication: Asystole  Technical Description:   CPR performance duration: 15  Was defibrillation or cardioversion used ? no  Was external pacer placed ? no  Was patient intubated pre/post CPR ? yes  Was transvenous pacer placed ? no  Medications Administered Include      Yes/no Amiodarone   Atropin Yes   Calcium   Epinephrine Yes - 3   Lidocaine   Magnesium   Norepinephrine Yes - max  Phenylephrine   Sodium bicarbonate Yes - 3 amps  Vasopression    Evaluation  Final Status - Was patient successfully resuscitated ? no  If successfully resuscitated - what is current rhythm ? no If successfully resuscitated - what is current hemodynamic status ? n/a  Miscellaneous Information Patient down to have unstable airway secured by ENT.  Patient on high dose vasopressor support, progressively higher doses. Surgery deemed necessary due to poor oxygenation and ventilation despite king airway placed, risks understood by family prior to surgical intervention.  Patient in severe cardiac shock with multiorgan failure following prolonged cpr and LHC with PCI to LAD.  ENT placed tracheostomy without evidence of bleeding.  Placed patient on ventilator.  No change in ventilator settings, however patient became bradycardic, which was unresponsive to atropine.  CPR initiated for PEA arrest that was found to be asystole with first pulse check.  Despite aggressive ACLS, patient without ROSC.  Following 15 minutes of asystole, due to patients severe cardiogenic shock and multiorgan failure following multiple PEA arrests, the code was called with time of death being 4:01 am 10-09-22  Levert Feinstein 01/14/154:49 AM

## 2013-10-27 NOTE — Progress Notes (Addendum)
ANTICOAGULATION CONSULT NOTE - Initial Consult  Pharmacy Consult for Heparin, Aggrastat Indication: STEMI, s/p cath, IABP in place  Allergies not on file  Patient Measurements: Height: 6' (182.9 cm) Weight: 330 lb (149.687 kg) IBW/kg (Calculated) : 77.6 Heparin Dosing Weight: ~113 kg  Vital Signs: BP: 152/92 mmHg (01/12 0150) Pulse Rate: 137 (01/12 0150)  Labs:  Recent Labs  05-Nov-2013 2138 05-Nov-2013 2142  HGB 8.3* 8.5*  HCT 25.4* 25.0*  PLT 286  --   LABPROT 19.4*  --   INR 1.69*  --   CREATININE 4.49* 4.90*  TROPONINI 0.57*  --    Estimated Creatinine Clearance: 26.8 ml/min (by C-G formula based on Cr of 4.9).  Assessment: 53 y/o M CODE STEMI, multiple code blue events, s/p cath, IABP in place, to begin heparin and continue Aggrastat for 12 hours per MD order. Per RN, going to OR for trach. Labs as above, noted renal dysfunction.   Goal of Therapy:  Heparin level 0.2-0.5 units/ml Monitor platelets by anticoagulation protocol: Yes   Plan:  -NO BOLUS -Start heparin drip at 1200 units/hr after patient gets back from OR for trach -8 hour HL will be timed after start time is determined  -Daily CBC/HL -F/U cardiology plans  -Aggrastat 0.075 mcg/kg/min for 12 hours per MD order -CBC at 1000 to monitor platelets   Abran Duke 10/10/2013,2:09 AM  09/27/2013 .2:35 AM Plans to proceed with hypothermia, adjust heparin drip to start at 900 units/hr once patient back from OR [place], PharmD

## 2013-10-27 NOTE — Progress Notes (Signed)
53 y/o WM received via bed from cath lab s/p cardiac arrest and angioplasty with stent placement to LAD to room  2H09 @ 0150.  IABP in place right femoral artery at 1:1 frequency.  Placed on CCU monitoring equipment; CHG bath completed; [place] pads placed on patient & induced hypothermia initiated.  Procedure explained to wife prior to patient's arrival to unit.  Vital signs maintained with Levophed drip infusing at 60 mcg/min and Epinephrine drip infusing at 3 mcg/min via peripheral IV left forearm.  Drips titrated to maintain MAP > or = 85.  12 lead EKG completed and reviewed by Dr Marijean Bravo & Dr Terrence Dupont.  Target temperature reached @ 0200.  Labs drawn; CBG performed and treated.  Dr Redmond Baseman consulted for tracheostomy placement.  Dr Redmond Baseman and I met with wife to discuss procedure and answer all of her questions.  Informed consent obtained.  Rhythm changed to atrial fibrillation with RVR.  Amiodarone bolus given & drip started per order from Dr Terrence Dupont.   BP labile; drips titrated accordingly.  Two amps of sodium bicarb given at [place] as ordered per Dr Marijean Bravo.  Patient transported to OR via bed with ventilator; Su Hilt;  IABP & ZOLL defibrillator.  Report given to Nurse anesthetist.  See OR notes for continuation of care.   Patient returned to unit from OR at 0430.  Charge nurse, Fernand Parkins spoke with medical examiner.  Endotracheal tube &  2 peripheral IV's removed per medical examiner's permission.  Patient cleaned for viewing from wife.  Chaplain in attendance.  Wife at bedside.  Emotional support provided.

## 2013-10-27 NOTE — Progress Notes (Signed)
Utilization Review Completed.Edward Dyer T1/08/2014  

## 2013-10-27 NOTE — H&P (Signed)
PULMONARY  / CRITICAL CARE MEDICINE  Name: Edward BaltimoreBrian K Celaya MRN: [id number] DOB: 05-05-1961    ADMISSION DATE:  10/08/2013 CONSULTATION DATE:  1/12  REFERRING MD :  ED PRIMARY SERVICE: Pulm CCM  CHIEF COMPLAINT:  PEA arrest, LAD thrombus s/p PCI and stent placement, cardiogenic shock  BRIEF PATIENT DESCRIPTION: 53 yo male with PEA arrest, ACLS for 30 minutes prior to ROSC in the field.  Underwent LHC, LAD stent placed with noted multivessel disease, transferred to CICU with cardiogenic shock.  SIGNIFICANT EVENTS / STUDIES:  CXR  LINES / TUBES: 1/12 Right femoral arterial sheath with balloon pump Right Femoral venous sheath Left femoral arterial line  CULTURES: none  ANTIBIOTICS: None  HISTORY OF PRESENT ILLNESS:   53 yo male with PEA arrest, ACLS for 30 minutes prior to ROSC in the field.    Noted chest pain, diaphoresis, prior to losing consciousness.  Good CPR initated until EMS arrived, where patient received 6 rounds of epi, placed on external pacer due to bradycardia, and king airway placed.  At [facility], patient with loss of pulse again, ACLS initiated for Vfib with 1 round of CPR, epi given, and defibrillated with ROSC.    In ED, ER physician and anesthesia evaluated unstable airway, due to severe oral swelling and king airway.  Patient with Avr and anterior lead elevations and at the time it was decided by the team that this was priority.    Taken to cath lab, undergoing LHC and IABP placement. Patient requiring high dose pressors at this time, CCM called to assist with vent and hemodynamics.  Patient with severely depressed cardiac function, minimal improvement with balloon pump placement, increasing pressor requirements during procedure.  Underwent PCI and stent to LAD.  Severe multivessel disease noted; likely distal embolization.  Patient placed on aggrestat, DAPT with brillinta and asa.  ENT called for unstable airway, the decision was made perform an emergent  tracheostomy.  Patient unstable   PAST MEDICAL HISTORY :  PMH:  HTN DM No history of kidney disease  PSx Hernia repair  Unknown medications  No allergies   FAMILY HISTORY:  No family history on file. Unknown (possibly a uncle with mi) SOCIAL HISTORY:  has no tobacco, alcohol, and drug history on file. Social alcohol No tobacco  REVIEW OF SYSTEMS:  Unable to obtain due to acute illness  SUBJECTIVE:   VITAL SIGNS: Temp:  [91.9 F (33.3 C)] 91.9 F (33.3 C) (01/12 0200) Pulse Rate:  [42-137] 137 (01/12 0150) Resp:  [16-28] 28 (01/12 0150) BP: (54-200)/(38-106) 152/92 mmHg (01/12 0150) SpO2:  [80 %-89 %] 80 % (01/11 2242) FiO2 (%):  [100 %] 100 % (01/12 0200) Weight:  [307 lb 5.1 oz (139.4 kg)-330 lb (149.687 kg)] 307 lb 5.1 oz (139.4 kg) (01/12 0200) HEMODYNAMICS:   VENTILATOR SETTINGS: Vent Mode:  [-] PRVC FiO2 (%):  [100 %] 100 % Set Rate:  [14 bmp-28 bmp] 28 bmp Vt Set:  [500 mL-620 mL] 500 mL PEEP:  [12 cmH20-14 cmH20] 12 cmH20 Plateau Pressure:  [40 cmH20] 40 cmH20 INTAKE / OUTPUT: Intake/Output     01/11 0701 - 01/12 0700   I.V. (mL/kg) 13.8 (0.1)   Total Intake(mL/kg) 13.8 (0.1)   Net +13.8         PHYSICAL EXAMINATION: General:  Obtunded, toxic appearing, hypothermic prior to cooling.   Neuro:  GCS 3 on sedation and paralytics HEENT:  Massive oral swelling, tongue engorged and fills the mouth, evidence of oral bleeding  Cardiovascular:  Tachycardiac, afib with RVR, cool and clammy extremities with noted pulse, no MRG, accuracy in question as unable to standby balloon pump due to hemodynamic instability. Lungs:  Rales throughout, poor air movmeent Abdomen:  Distended, hypoactive bowel sounds, not tense, no hepatospleenomegaly Musculoskeletal:  No edema, c Skin:  No cyanosis, rashes, or ecchymosis.  Patient cool, no diaphoresis   LABS:  Recent Labs Lab 10/13/2013 2138 10/17/2013 2142 10/25/2013 2153  HGB 8.3* 8.5*  --   WBC 20.1*  --   --   PLT 286   --   --   NA 138 141  --   K 5.9* 5.8*  --   CL 102 113*  --   CO2 12*  --   --   GLUCOSE 272* 259*  --   BUN 78* 102*  --   CREATININE 4.49* 4.90*  --   CALCIUM 9.6  --   --   AST 256*  --   --   ALT 248*  --   --   ALKPHOS 116  --   --   BILITOT <0.2*  --   --   PROT 6.5  --   --   ALBUMIN 3.0*  --   --   INR 1.69*  --   --   LATICACIDVEN  --   --  9.55*  TROPONINI 0.57*  --   --    No results found for this basename: GLUCAP,  in the last 168 hours  CXR: Pulmonary edema, noted cardiomegaly no focal infiltrate  ASSESSMENT / PLAN:  PULMONARY A:  Hypoxic/Hypercapneic Respiratory Failure with inability to meet ventilatory demands with significant metabolic requirements  King airway in place at arrival Anesthesiology and ER evaluated, deemed too unstable to place ET tube with significantly difficult airway (LHC prioritized)  P: ENT to place tracheostomy  PRVC Despite MV of 12, unable to meet metabolic demands.   Avoid increases of PEEP as King airway with high pressures may lead to damaging gastric insuffiation  Peak airway pressures <30 Avoid dyssynchrony Sedation: Versed/Fentanyl  CARDIOVASCULAR A: Cardiogenic shock with multiple PEA arrests LHC to LAD with PCI and stent placement IABP  Vasopressors Plan to place patient on TTM, 36 P:  Cardiac Arrest Initial rhythm: PEA arrest Duration of ACLS: 30 mins + 5 Drugs administered: Multiple rounds of epinephrine, bicarb, calcium Etiology: STEMI GCS:  3 Discussed with cardiology, ENT, and EICU   P: Initiate TTM:  Goal temperature 36 degrees for 24 hours Glucose control with goal 160-180; IV insulin protocol in place Avoid hypo and hyperoxia; FiO2 to keep SpO2 >95 Follow electrolytes as per protocol Ionized Calcium corrected RAAS Goal -5 Pending formal Echo Aggressive management of shivering with the following :Fentanyl gtt :Paralytics Levophed and Epinephrine gtt in place to maintain MAP IABP having  difficulty augmenting due to atrial fibrillation Aggrastat gtt for 12 hours with DAPT with brilinta   RENAL A:  AKI Likely due to Cardiogenic P: Optimize hemodynamics with judicious use of crystalloid and vasopressors MAP Goal >65 for adequate perfusion Foley for strict I/O's Avoid nephrotoxic substances Renally dose medications  GASTROINTESTINAL A:  GI prophylaxis P:   PPI in place  HEMATOLOGIC A:  High bleeding risk P:  Plan on transfusion >8  INFECTIOUS A:  N/A P:   N/A  ENDOCRINE A:  Glucose control   P:   As per ICU protocol  NEUROLOGIC A:  S/P arrest P:   Under hyperthermia protocol  I have personally  obtained a history, examined the patient, evaluated laboratory and imaging results, formulated the assessment and plan and placed orders. CRITICAL CARE: The patient is critically ill with multiple organ systems failure and requires high complexity decision making for assessment and support, frequent evaluation and titration of therapies, application of advanced monitoring technologies and extensive interpretation of multiple databases. Critical Care Time devoted to patient care services described in this note is 120 minutes.    Pulmonary and Critical Care Medicine [facility] Pager: [phone]  10/03/2013, 2:26 AM

## 2013-10-27 NOTE — CV Procedure (Signed)
Left cardiac cath/PTCA stenting/intra-aortic balloon pump report dictated on 10/24/2013 dictation number is [id number]

## 2013-10-27 NOTE — Progress Notes (Signed)
Chaplain notified of pt's death at 04:00. Presented to pt's wife in [facility] waiting area. Assisted staff with supporting Edward Dyer, communicating with her, and assisting her with preparations to return home and transport her husband's body home. Assisted staff with gathering her contact information, the deceased's PCP information, and coordinating support with wife. Chaplain provided emotional, spiritual, and grief support, caring presence, and hospitality.   Medical Team and [facility] recommended that [place] fly back to [place] ASAP. She shared funeral home information with team. Edward Dyer said she wanted to wait at [facility] until her daughter and sister-in-law arrive later today, because she does not want to travel home alone.   Will refer to day chaplain for additional family support. Please page if needed immediately.   Guy Sandifer [place], [place] [phone]

## 2013-10-27 NOTE — Anesthesia Preprocedure Evaluation (Addendum)
Anesthesia Evaluation  Patient identified by MRN, date of birth, ID band Patient unresponsive    Reviewed: Unable to perform ROS - Chart review only  Airway       Dental   Pulmonary  Intubated c laryngeal airway device,resp failure   + decreased breath sounds      Cardiovascular hypertension, + CAD, + Past MI and +CHF + dysrhythmias Rhythm:Regular Rate:Tachycardia     Neuro/Psych    GI/Hepatic   Endo/Other    Renal/GU      Musculoskeletal   Abdominal   Peds  Hematology   Anesthesia Other Findings   Reproductive/Obstetrics                          Anesthesia Physical Anesthesia Plan  ASA: IV and emergent  Anesthesia Plan: General   Post-op Pain Management:    Induction: Intravenous and Inhalational  Airway Management Planned: Tracheostomy  Additional Equipment:   Intra-op Plan:   Post-operative Plan: Post-operative intubation/ventilation  Informed Consent: I have reviewed the patients History and Physical, chart, labs and discussed the procedure including the risks, benefits and alternatives for the proposed anesthesia with the patient or authorized representative who has indicated his/her understanding and acceptance.     Plan Discussed with:   Anesthesia Plan Comments:         Anesthesia Quick Evaluation

## 2013-10-27 NOTE — OR Nursing (Signed)
CPR started @0346 , then called @0401  per Dr. Ala Dach.

## 2013-10-27 NOTE — Transfer of Care (Signed)
Immediate Anesthesia Transfer of Care Note  Patient: Edward Dyer  Procedure(s) Performed: Procedure(s): TRACHEOSTOMY (N/A)  Pt expired .

## 2013-10-27 NOTE — Cardiovascular Report (Signed)
[name]:  [place], Oden                ACCOUNT NO.:  [id number]  MEDICAL RECORD NO.:  [id number]  LOCATION:  2H09C                        FACILITY:  [facility]  PHYSICIAN:  Isabellah Sobocinski N. Sharyn LullHarwani, M.D. DATE OF BIRTH:  12/15/1960  DATE OF PROCEDURE:  10/08/2013 DATE OF DISCHARGE:                           CARDIAC CATHETERIZATION   PROCEDURE: 1. Left cardiac cath with selective left and right coronary     angiography via left groin using Judkins technique. 2. Insertion of intra-aortic balloon pump via right femoral arterial     approach. 3. Successful percutaneous transluminal coronary angioplasty to ostial     left anterior descending using 2.5 x 15 mm long Emerge balloon. 4. Percutaneous transluminal coronary angioplasty to distal, mid,     proximal and ostial left anterior descending using 2.0 x 20 mm long     balloon, and then 2.5 x 30 mm long, Emerge balloon. 5. Successful deployment of 3.0 x 38 mm long Xience Alpine drug-     eluting stent in ostial and proximal left anterior descending. 6. Successful postdilatation of the stent using 3.0 x 20 mm long Rachel     Trek balloon going up to 18 atmospheric pressure.  INDICATION FOR THE PROCEDURE:  Mr. Edward Dyer is a 53 year old male, resident of [place] with past medical history significant for hypertension, non-insulin-dependent diabetes mellitus, morbid obesity, remote tobacco abuse, history of obstructive sleep apnea/obesity hypoventilation syndrome, was [place] he developed sudden onset of chest pain, pressure while flying associated with shortness of breath, and the plane was diverted to [place].  The patient subsequently had out of hospital cardiac arrest and CPR and was intubated on the field.  The patient had at least 6 rounds of epi and had CPR for approximately 30 minutes outside the hospital.  In the ED, the patient had multiple episodes of PEA, cardiac arrest, and ACLS protocol was followed.  The  patient was noted to be in cardiogenic shock and renal failure.  The patient unresponsive, intubated on high doses of pressors.  Discussed with the patient's wife regarding his critical condition and cardiogenic shock with renal failure and acute ST-elevation in anteroseptal leads suggestive of Q- waves in the septal leads with ST elevation suggestive of acute anteroseptal wall MI.  The patient's wife wanted everything to be done. Discussed at length regarding death, MI, stroke, significant anoxic brain injury, local vascular complications, renal failure requiring hemodialysis, etc. and consented for PCI.  PROCEDURE:  After obtaining the informed consent, the patient was brought to the cath lab and was placed on fluoroscopy table.  Right and left groin were prepped and draped in usual fashion.  Xylocaine 1% was used for local anesthesia.  Intra-aortic balloon pump was inserted via right femoral arterial approach without difficulty and 6-French venous sheath was placed in the right femoral vein.  A 6-French arterial sheath was placed in the left femoral artery.  Both the sheaths were aspirated and flushed.  A 6-French left Judkins catheter was advanced over the wire under fluoroscopic guidance up to the ascending aorta.  Wire was pulled out.  The catheter was aspirated and connected to the Manifold. Catheter was further advanced  and engaged into left coronary ostium. Multiple views of the left system were taken.  Next, the catheter was disengaged and was pulled out over the wire and was replaced with 6- [place] right Judkins catheter, which was advanced over the wire under fluoroscopic guidance up to the ascending aorta.  Wire was pulled out. The catheter was aspirated and connected to the Manifold.  Catheter was further advanced and engaged into right coronary ostium.  A single view of right coronary artery was obtained.  The catheter was disengaged and was pulled out over the wire and  was replaced with 6-French pigtail catheter at the end of the procedure which was advanced over the wire under fluoroscopic guidance up to the ascending aorta.  Wire was pulled out.  The catheter was aspirated and connected to the Manifold. Catheter was further advanced across the aortic valve into the LV.  LV pressures were recorded.  His LVEDP was 28 mmHg.  A pigtail catheter was pulled out of the LV.  There was no gradient across the aortic valve. The pigtail catheter was pulled out.  Sheaths were aspirated and flushed.  FINDINGS:  Left main was patent.  LAD was 100% occluded at the ostium, left circumflex has 20-25% proximal stenosis, High OM 1 has 60-70% ostial stenosis.  OM 2 was very very small.  OM 3 has 70-75% ostial stenosis which is a small vessel.  RCA has 70-75% mid and distal junction stenosis.  PDA is patent.  PLV branch has 80-85% ostial stenosis which is small vessel distally.  INTERVENTIONAL PROCEDURE: 1. Successful PTCA to ostial LAD was done using 2.5 x 15 mm long,     Emerge Monorail balloon for predilatation.  Angiogram showed a     large thrombus burden in mid LAD and TIMI 1 flow. 2. PTCA to distal, mid, and proximal LAD was done using 2.0 x 20 mm     long, Emerge balloon and then 2.5 x 30 mm long, Emerge balloon from     distal to proximal LAD.  Multiple inflations were done going up to     8-10 atmospheric pressure and then 3.0 x 38 mm long Xience Alpine     drug-eluting stent was deployed in ostial and proximal LAD at 10     atmospheric pressure.  The stent was post dilated using 3.0 x 20 mm     long [place] Trek balloon going up to 18 atmospheric pressure.  Lesion     dilated from 100% to 0% residual with excellent TIMI grade 3 distal     flow.  The patient received weight based Angiomax and Aggrastat     during the procedure.  The patient was titrated on Levophed and     epinephrine drip during the procedure.  The patient tolerated the     procedure well.  The  patient remains intubated, unresponsive, and     sedated.  The patient was transferred to CCU in stable condition.     Discussed with wife at length regarding his critical condition and     understands.     Eduardo Osier. Sharyn Lull, M.D.     MNH/MEDQ  D:  10/04/2013  T:  10/22/2013  Job:  [id number]

## 2013-10-27 NOTE — Progress Notes (Signed)
Post mortem care completed after wife visited with patient.  Wife removed wedding band and took all personal belongings.  Patient transported to morgue.

## 2013-10-27 NOTE — OR Nursing (Signed)
Pt transported to 2900 after death.  Wife requested to see patient before going to the morgue.  2900 staff to complete morgue checklist.

## 2013-10-27 NOTE — Anesthesia Postprocedure Evaluation (Signed)
  Anesthesia Post-op Note  Patient: Edward Dyer  Procedure(s) Performed: Procedure(s): TRACHEOSTOMY (N/A)

## 2013-10-27 NOTE — Op Note (Signed)
NAMEKRISTAIN, Edward Dyer                ACCOUNT NO.:  [id number]  MEDICAL RECORD NO.:  [id number]  LOCATION:  2H09C                        FACILITY:  [facility]  PHYSICIAN:  Antony Contras, MD     DATE OF BIRTH:  10/28/1960  DATE OF PROCEDURE:  10/17/2013 DATE OF DISCHARGE:                              OPERATIVE REPORT   PREOPERATIVE DIAGNOSIS:  Respiratory failure and unstable airway, acute coronary syndrome.  POSTOPERATIVE DIAGNOSIS:  Respiratory failure and unstable airway, acute coronary syndrome.  PROCEDURE:  Tracheostomy.  SURGEON:  Antony Contras, MD.  ANESTHESIA:  General anesthesia.  COMPLICATIONS:  During the procedure, patient's blood pressure decreased and became difficult to maintain.  At the time of transfer from his [facility] Airway to the tracheostomy that had been placed successfully, patient became asystolic, and in spite of CPR and administration of atropine, epinephrine, and bicarb; patient never regained an electrical activity in the heart or pulse.  The critical care physician came into the room after 15 minutes of CPR and assessed the situation.  He did not feel further efforts were worthwhile.  Patient was pronounced dead.  DESCRIPTION OF PROCEDURE:  The patient was identified in the intensive care unit, and informed consent was obtained from the wife including discussion of risks, benefits, alternatives, to proceed with tracheostomy in order to establish a stable airway.  The patient was brought to the operative suite and put on the table in supine position. Anesthesia was maintained via his King tube that was placed in the field.  He had very high rates of pressors being given and an aortic balloon pump running.  The eyes taped closed and a shoulder roll was placed.  The neck was examined and an incision was marked with a marking pen in a vertical fashion at lower neck.  This was injected with 1% lidocaine with 1:100,000 epinephrine.  The neck was prepped and  draped in sterile fashion.  Incision was made with the Bovie electrocautery through the skin and the subcutaneous fat was removed using Bovie electrocautery.  Dissection was continued for subcutaneous fat down to the strap muscles, which were then divided in the midline.  Strap muscles were retracted either side.  Dissection was continued through the thyroid isthmus.  Bleeding was controlled throughout the case.  The cricoid hook was placed in the cricoid cartilage and then that was used to elevate the airway.  The soft tissues were then cleared off the anterior tracheal wall.  A proximal extended length #8 cuffed Shiley trach tube was then opened on the field and the cuff tested.  An incision was then made between rings 2 and 3 of the trachea using a 15 blade scalpel.  Stay sutures with 2-0 silk suture were then placed in the ring above and the ring below the trach site.  A tracheostomy tube was then placed through the trach site and into proper position and the cuff was inflated.  The inner cannula was placed and the anesthesia circuit was hooked to the trach tube.  It was during this time that patient lost a pulse and electrical activity in the heart.  Drapes were quickly removed and CPR was initiated.  For further details of the code, please see the anesthesia record.  After 15 minutes of CPR and administration of epinephrine; atropine and bicarb on multiple occasions, and review by the critical care doctor who came into the room, patient was pronounced dead.     Antony Contraswight D Chieko Neises, MD     DDB/MEDQ  D:  10/08/2013  T:  10/08/2013  Job:  [id number]

## 2013-10-27 NOTE — Brief Op Note (Signed)
10/25/2013 - 09/26/2013  4:14 AM  PATIENT:  Edward Dyer  53 y.o. male  PRE-OPERATIVE DIAGNOSIS: Respiratory failure, unstable airway  POST-OPERATIVE DIAGNOSIS:  same  PROCEDURE:  Procedure(s): TRACHEOSTOMY (N/A)  SURGEON:  Surgeon(s) and Role:    * Christia Reading, MD - Primary  PHYSICIAN ASSISTANT:   ASSISTANTS: none   ANESTHESIA:   general  EBL:  Total I/O In: 13.8 [I.V.:13.8] Out: -   BLOOD ADMINISTERED:none  DRAINS: none   LOCAL MEDICATIONS USED:  LIDOCAINE   SPECIMEN:  No Specimen  DISPOSITION OF SPECIMEN:  N/A  COUNTS:  YES  TOURNIQUET:  * No tourniquets in log *  DICTATION: .Other Dictation: Dictation Number [id number]  PLAN OF CARE: Patient expired after tracheostomy tube placement after his blood pressure steadily declined and he became pulseless and asystolic.  CPR and medications were administred for 15 minutes to no avail.  PATIENT DISPOSITION:  Expired   Delay start of Pharmacological VTE agent (>24hrs) due to surgical blood loss or risk of bleeding: N/A

## 2013-10-27 NOTE — H&P (Signed)
Edward Dyer is an 53 y.o. male.   Chief Complaint: Out of Hospital cardiac arrest HPI: Patient is 53 year old male with past medical history significant for hypertension non-insulin-dependent diabetes mellitus morbid obesity, remote history of tobacco abuse, obstructive sleep apnea/obesity hypoventilation syndrome, while flying back from [place] to [place] developed sudden onset of chest pain pressure while flying associated with shortness of breath and a plane was dilated to [facility] patient subsequently had out of hospital CPR and was intubated on the field patient had at least 6 rounds of epi and had CPR for 30 minutes outside the hospital. In ED patient had multiple episodes of PE a cardiac arrest the and ACLS protocol was followed patient was noted to be in cardiogenic shock and renal failure. Patient's unresponsive intubated. Discussed with patient's wife regarding his critical condition and cardiogenic shock with renal failure and severe anemia wanted everything to be done.  No past medical history on file.  No past surgical history on file.  No family history on file. Social History:  has no tobacco, alcohol, and drug history on file.  Allergies: Allergies not on file  No prescriptions prior to admission    Results for orders placed during the hospital encounter of 10/21/2013 (from the past 48 hour(s))  TROPONIN I     Status: Abnormal   Collection Time    10/17/2013  9:38 PM      Result Value Range   Troponin I 0.57 (*) <0.30 ng/mL   Comment:            Due to the release kinetics of cTnI,     a negative result within the first hours     of the onset of symptoms does not rule out     myocardial infarction with certainty.     If myocardial infarction is still suspected,     repeat the test at appropriate intervals.     CRITICAL RESULT CALLED TO, READ BACK BY AND VERIFIED WITH:     E BREWER,RN 10/04/2013 2231 RHOLMES  CBC     Status: Abnormal   Collection Time    10/24/2013  9:38  PM      Result Value Range   WBC 20.1 (*) 4.0 - 10.5 K/uL   RBC 2.73 (*) 4.22 - 5.81 MIL/uL   Hemoglobin 8.3 (*) 13.0 - 17.0 g/dL   HCT 25.4 (*) 39.0 - 52.0 %   MCV 93.0  78.0 - 100.0 fL   MCH 30.4  26.0 - 34.0 pg   MCHC 32.7  30.0 - 36.0 g/dL   RDW 13.6  11.5 - 15.5 %   Platelets 286  150 - 400 K/uL  COMPREHENSIVE METABOLIC PANEL     Status: Abnormal   Collection Time    10/06/13  9:38 PM      Result Value Range   Sodium 138  137 - 147 mEq/L   Potassium 5.9 (*) 3.7 - 5.3 mEq/L   Chloride 102  96 - 112 mEq/L   CO2 12 (*) 19 - 32 mEq/L   Glucose, Bld 272 (*) 70 - 99 mg/dL   BUN 78 (*) 6 - 23 mg/dL   Creatinine, Ser 4.49 (*) 0.50 - 1.35 mg/dL   Calcium 9.6  8.4 - 10.5 mg/dL   Total Protein 6.5  6.0 - 8.3 g/dL   Albumin 3.0 (*) 3.5 - 5.2 g/dL   AST 256 (*) 0 - 37 U/L   Comment: HEMOLYSIS AT THIS LEVEL MAY AFFECT RESULT  ALT 248 (*) 0 - 53 U/L   Alkaline Phosphatase 116  39 - 117 U/L   Total Bilirubin <0.2 (*) 0.3 - 1.2 mg/dL   Comment: REPEATED TO VERIFY   GFR calc non Af Amer 14 (*) >90 mL/min   GFR calc Af Amer 16 (*) >90 mL/min   Comment: (NOTE)     The eGFR has been calculated using the CKD EPI equation.     This calculation has not been validated in all clinical situations.     eGFR's persistently <90 mL/min signify possible Chronic Kidney     Disease.  PROTIME-INR     Status: Abnormal   Collection Time    10/26/2013  9:38 PM      Result Value Range   Prothrombin Time 19.4 (*) 11.6 - 15.2 seconds   INR 1.69 (*) 0.00 - 1.49  POCT I-STAT, CHEM 8     Status: Abnormal   Collection Time    10/09/2013  9:42 PM      Result Value Range   Sodium 141  137 - 147 mEq/L   Potassium 5.8 (*) 3.7 - 5.3 mEq/L   Chloride 113 (*) 96 - 112 mEq/L   BUN 102 (*) 6 - 23 mg/dL   Creatinine, Ser 4.90 (*) 0.50 - 1.35 mg/dL   Glucose, Bld 259 (*) 70 - 99 mg/dL   Calcium, Ion 1.38 (*) 1.12 - 1.23 mmol/L   TCO2 15  0 - 100 mmol/L   Hemoglobin 8.5 (*) 13.0 - 17.0 g/dL   HCT 25.0 (*) 39.0 -  52.0 %  POCT I-STAT TROPONIN I     Status: Abnormal   Collection Time    10/17/2013  9:51 PM      Result Value Range   Troponin i, poc 0.56 (*) 0.00 - 0.08 ng/mL   Comment NOTIFIED PHYSICIAN     Comment 3            Comment: Due to the release kinetics of cTnI,     a negative result within the first hours     of the onset of symptoms does not rule out     myocardial infarction with certainty.     If myocardial infarction is still suspected,     repeat the test at appropriate intervals.  URINALYSIS, ROUTINE W REFLEX MICROSCOPIC     Status: Abnormal   Collection Time    09/29/2013  9:52 PM      Result Value Range   Color, Urine YELLOW  YELLOW   APPearance CLOUDY (*) CLEAR   Specific Gravity, Urine 1.014  1.005 - 1.030   pH 5.0  5.0 - 8.0   Glucose, UA 100 (*) NEGATIVE mg/dL   Hgb urine dipstick MODERATE (*) NEGATIVE   Bilirubin Urine NEGATIVE  NEGATIVE   Ketones, ur NEGATIVE  NEGATIVE mg/dL   Protein, ur >300 (*) NEGATIVE mg/dL   Urobilinogen, UA 0.2  0.0 - 1.0 mg/dL   Nitrite NEGATIVE  NEGATIVE   Leukocytes, UA NEGATIVE  NEGATIVE  URINE MICROSCOPIC-ADD ON     Status: Abnormal   Collection Time    10/10/2013  9:52 PM      Result Value Range   Squamous Epithelial / LPF FEW (*) RARE   WBC, UA 0-2  <3 WBC/hpf   RBC / HPF 7-10  <3 RBC/hpf   Bacteria, UA FEW (*) RARE   Urine-Other AMORPHOUS URATES/PHOSPHATES    CG4 I-STAT (LACTIC ACID)     Status: Abnormal  Collection Time    10/26/2013  9:53 PM      Result Value Range   Lactic Acid, Venous 9.55 (*) 0.5 - 2.2 mmol/L   Dg Chest Port 1 View  10/22/2013   CLINICAL DATA:  Shortness of breath. Post cardiorespiratory arrest with resuscitation.  EXAM: PORTABLE CHEST - 1 VIEW  COMPARISON:  None.  FINDINGS: The entire left side of the chest was not included on the image. External pacing pads. Cardiac silhouette enlarged. Diffuse interstitial and perihilar airspace pulmonary edema bilaterally.  IMPRESSION: Cardiomegaly with interstitial and  perihilar airspace pulmonary edema.   Electronically Signed   By: Thomas  Lawrence M.D.   On: 10/04/2013 22:10    Review of Systems  Unable to perform ROS: intubated    Blood pressure 77/51, pulse 117, resp. rate 21, height 6' (1.829 m), weight 149.687 kg (330 lb), SpO2 80.00%. Physical Exam  Eyes:  Pupils were dilated fixed nonreactive  Neck: Neck supple. JVD present. No tracheal deviation present. No thyromegaly present.  Cardiovascular:  Regular rate and rhythm S1-S2 soft soft there is 3 gallop noted  Respiratory:  Bilateral os breath sounds and rales noted  GI: Soft. Bowel sounds are normal. He exhibits distension.  Musculoskeletal:  No clubbing cyanosis 3+ edema noted  Neurological:  Intubated sedated unresponsive     Assessment/Plan Out of hospital cardiac arrest Acute anteroseptal wall myocardial infarction with cardiogenic shock Acute pulmonary edema Acute respiratory failure secondary to above he  Mixed Metabolic and respiratory acidosis Probably acute on chronic renal failure Anemia of chronic disease Plan Discuss with's wife regarding his critical condition and various options of treatment wife wanted everything to be done understands the risk of death myocardial infarction stroke renal failure requiring hemodialysis blood transfusion etc. local vascular complications and consented for PCI   HARWANI,MOHAN N 10/02/2013, 1:05 AM    

## 2013-10-27 NOTE — Progress Notes (Signed)
Chaplain received page to ED for CPR patient at approximately 9pm on 1/11. Consulted with Geographical information systems officer and learned that pt and his wife were traveling and were not from [place]. Presented to pt's wife, Vikki Ports, in [facility] ED consultation room B. She was very upset and talking on the phones with multiple family members. She explained that they were flying from a funeral in [place] back home to [place] when her husband went into cardiac arrest in flight. The place made an emergency landing in [place]. The ambulance brought her to [facility] with her husband. She knows no one in the state. Her daughter and sister-in-law are planning to drive here in the morning to be with her. Chaplain remained with pt's wife, escorting her to [facility] waiting room with the [facility] when her husband was transported to the cath lab. Chaplain provided hospitality, ministry of presence, emotional and spiritual support, and acted as Print production planner between pt's wife and the medical team. Will follow up throughout the night. Please page if needed immediately.   Guy Sandifer [place], [place] [phone]

## 2013-10-27 NOTE — H&P (Signed)
Edward Dyer is an 53 y.o. male.   Chief Complaint: respiratory failure, unstable airway HPI: 53 year old male went into PEA arrest on an airplane and required 30 minutes of CPR until the plane landed.  He was intubated with a "King" airway in the field, having been found to have significant swelling in the mouth.  A pulse was established in the ER and he was taken emergently to the cath lab and the LAD was opened.  He is now in the ICU initiating hypothermia protocol, has a balloon pump, and is on Integrelin and Heparin with the "King" airway, which is felt to be an unstable airway situation with danger of losing the airway completely, should it be dislodged.  Neither the CCM team nor anesthesiology are comfortable attempting any tube exchange at the bedside and an urgent tracheostomy is requested.  No past medical history on file.  No past surgical history on file.  No family history on file. Social History:  has no tobacco, alcohol, and drug history on file.  Allergies: Allergies not on file  No prescriptions prior to admission    Results for orders placed during the hospital encounter of 09/28/2013 (from the past 48 hour(s))  TROPONIN I     Status: Abnormal   Collection Time    10/01/2013  9:38 PM      Result Value Range   Troponin I 0.57 (*) <0.30 ng/mL   Comment:            Due to the release kinetics of cTnI,     a negative result within the first hours     of the onset of symptoms does not rule out     myocardial infarction with certainty.     If myocardial infarction is still suspected,     repeat the test at appropriate intervals.     CRITICAL RESULT CALLED TO, READ BACK BY AND VERIFIED WITH:     E BREWER,RN 10/23/2013 2231 RHOLMES  CBC     Status: Abnormal   Collection Time    10/02/2013  9:38 PM      Result Value Range   WBC 20.1 (*) 4.0 - 10.5 K/uL   RBC 2.73 (*) 4.22 - 5.81 MIL/uL   Hemoglobin 8.3 (*) 13.0 - 17.0 g/dL   HCT 25.4 (*) 39.0 - 52.0 %   MCV 93.0  78.0 - 100.0  fL   MCH 30.4  26.0 - 34.0 pg   MCHC 32.7  30.0 - 36.0 g/dL   RDW 13.6  11.5 - 15.5 %   Platelets 286  150 - 400 K/uL  COMPREHENSIVE METABOLIC PANEL     Status: Abnormal   Collection Time    10/12/2013  9:38 PM      Result Value Range   Sodium 138  137 - 147 mEq/L   Potassium 5.9 (*) 3.7 - 5.3 mEq/L   Chloride 102  96 - 112 mEq/L   CO2 12 (*) 19 - 32 mEq/L   Glucose, Bld 272 (*) 70 - 99 mg/dL   BUN 78 (*) 6 - 23 mg/dL   Creatinine, Ser 4.49 (*) 0.50 - 1.35 mg/dL   Calcium 9.6  8.4 - 10.5 mg/dL   Total Protein 6.5  6.0 - 8.3 g/dL   Albumin 3.0 (*) 3.5 - 5.2 g/dL   AST 256 (*) 0 - 37 U/L   Comment: HEMOLYSIS AT THIS LEVEL MAY AFFECT RESULT   ALT 248 (*) 0 - 53 U/L  Alkaline Phosphatase 116  39 - 117 U/L   Total Bilirubin <0.2 (*) 0.3 - 1.2 mg/dL   Comment: REPEATED TO VERIFY   GFR calc non Af Amer 14 (*) >90 mL/min   GFR calc Af Amer 16 (*) >90 mL/min   Comment: (NOTE)     The eGFR has been calculated using the CKD EPI equation.     This calculation has not been validated in all clinical situations.     eGFR's persistently <90 mL/min signify possible Chronic Kidney     Disease.  PROTIME-INR     Status: Abnormal   Collection Time    10/15/2013  9:38 PM      Result Value Range   Prothrombin Time 19.4 (*) 11.6 - 15.2 seconds   INR 1.69 (*) 0.00 - 1.49  POCT I-STAT, CHEM 8     Status: Abnormal   Collection Time    10/24/2013  9:42 PM      Result Value Range   Sodium 141  137 - 147 mEq/L   Potassium 5.8 (*) 3.7 - 5.3 mEq/L   Chloride 113 (*) 96 - 112 mEq/L   BUN 102 (*) 6 - 23 mg/dL   Creatinine, Ser 4.90 (*) 0.50 - 1.35 mg/dL   Glucose, Bld 259 (*) 70 - 99 mg/dL   Calcium, Ion 1.38 (*) 1.12 - 1.23 mmol/L   TCO2 15  0 - 100 mmol/L   Hemoglobin 8.5 (*) 13.0 - 17.0 g/dL   HCT 25.0 (*) 39.0 - 52.0 %  POCT I-STAT TROPONIN I     Status: Abnormal   Collection Time    10/17/2013  9:51 PM      Result Value Range   Troponin i, poc 0.56 (*) 0.00 - 0.08 ng/mL   Comment NOTIFIED  PHYSICIAN     Comment 3            Comment: Due to the release kinetics of cTnI,     a negative result within the first hours     of the onset of symptoms does not rule out     myocardial infarction with certainty.     If myocardial infarction is still suspected,     repeat the test at appropriate intervals.  URINALYSIS, ROUTINE W REFLEX MICROSCOPIC     Status: Abnormal   Collection Time    10/23/2013  9:52 PM      Result Value Range   Color, Urine YELLOW  YELLOW   APPearance CLOUDY (*) CLEAR   Specific Gravity, Urine 1.014  1.005 - 1.030   pH 5.0  5.0 - 8.0   Glucose, UA 100 (*) NEGATIVE mg/dL   Hgb urine dipstick MODERATE (*) NEGATIVE   Bilirubin Urine NEGATIVE  NEGATIVE   Ketones, ur NEGATIVE  NEGATIVE mg/dL   Protein, ur >300 (*) NEGATIVE mg/dL   Urobilinogen, UA 0.2  0.0 - 1.0 mg/dL   Nitrite NEGATIVE  NEGATIVE   Leukocytes, UA NEGATIVE  NEGATIVE  URINE MICROSCOPIC-ADD ON     Status: Abnormal   Collection Time    10/05/2013  9:52 PM      Result Value Range   Squamous Epithelial / LPF FEW (*) RARE   WBC, UA 0-2  <3 WBC/hpf   RBC / HPF 7-10  <3 RBC/hpf   Bacteria, UA FEW (*) RARE   Urine-Other AMORPHOUS URATES/PHOSPHATES    CG4 I-STAT (LACTIC ACID)     Status: Abnormal   Collection Time    10/18/2013  9:53  PM      Result Value Range   Lactic Acid, Venous 9.55 (*) 0.5 - 2.2 mmol/L   Dg Chest Port 1 View  10/19/2013   CLINICAL DATA:  Shortness of breath. Post cardiorespiratory arrest with resuscitation.  EXAM: PORTABLE CHEST - 1 VIEW  COMPARISON:  None.  FINDINGS: The entire left side of the chest was not included on the image. External pacing pads. Cardiac silhouette enlarged. Diffuse interstitial and perihilar airspace pulmonary edema bilaterally.  IMPRESSION: Cardiomegaly with interstitial and perihilar airspace pulmonary edema.   Electronically Signed   By: Evangeline Dakin M.D.   On: 09/28/2013 22:10    Review of Systems  Unable to perform ROS: intubated    Blood  pressure 152/92, pulse 137, resp. rate 28, height 6' (1.829 m), weight 149.687 kg (330 lb), SpO2 80.00%. Physical Exam  Constitutional: He appears well-developed and well-nourished.  Sedated and intubated.  HENT:  Head: Normocephalic and atraumatic.  Right Ear: External ear normal.  Left Ear: External ear normal.  Nose: Nose normal.  Mouth/Throat: Oropharynx is clear and moist.  Marked tongue edema, filling and protruding from mouth.  "King" airway in place.  Neck: Normal range of motion. Neck supple.  Cardiovascular:  Tachycardic.  Respiratory:  Intubated on ventilator.  GI:  Did not examine.  Genitourinary:  Did not examine.  Musculoskeletal:  Paralyzed.  Neurological:  Sedated.  Skin:  Cool and pale.  Psychiatric:  Cannot assess.     Assessment/Plan Respiratory failure, unstable airway I discussed the situation with his CCM doctor and the patient's wife.  I recommended proceeding with tracheostomy in the operating room in spite of the risks of heavy bleeding and his fresh post-catheter situation.  Risks, benefits, and alternatives were discussed with the wife.  She consents to proceed.  Jannel Lynne 06-Nov-2013, 2:05 AM

## 2013-10-27 DEATH — deceased

## 2014-09-04 ENCOUNTER — Encounter (HOSPITAL_COMMUNITY): Payer: Self-pay | Admitting: Cardiology

## 2015-02-03 IMAGING — CR DG CHEST 1V PORT
1 series · 1 of 1 positions shown · non-contrast
Comparison: None.

CLINICAL DATA: Shortness of breath. Post cardiorespiratory arrest
with resuscitation.

EXAM:
PORTABLE CHEST - 1 VIEW

[AP]
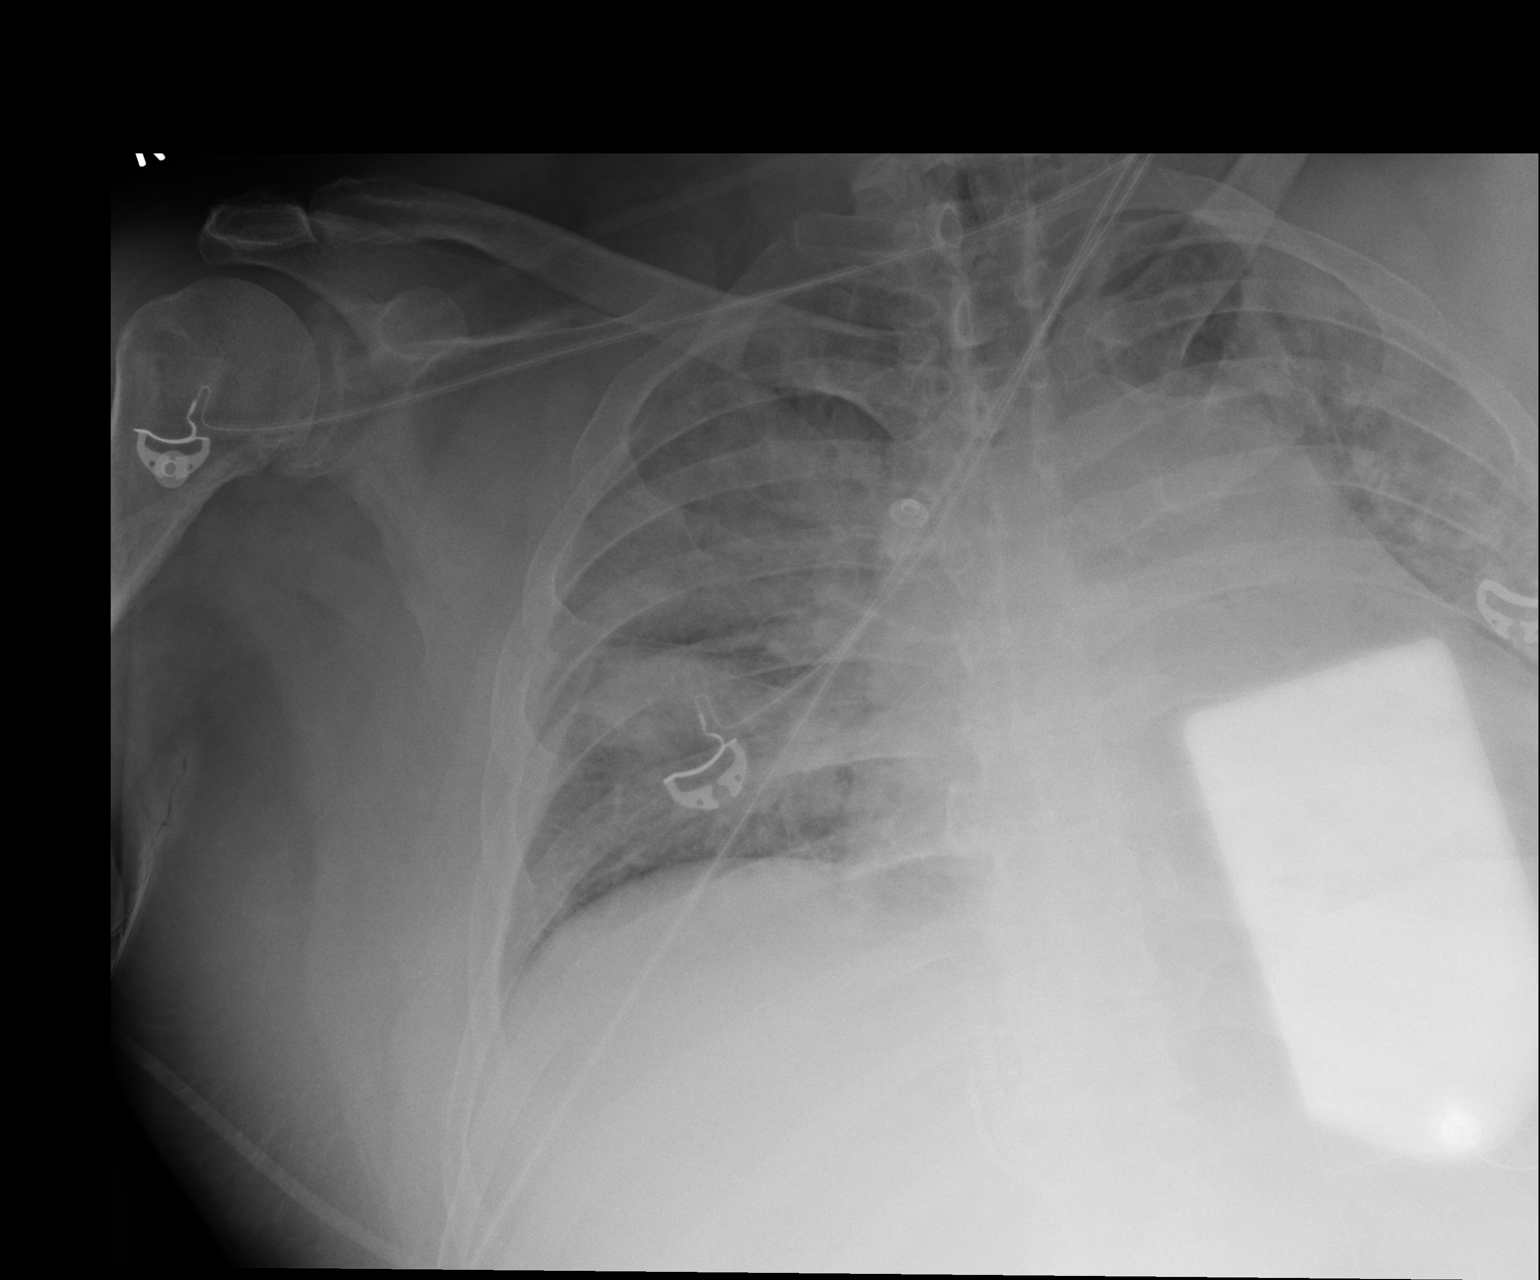

[1 of 1 positions shown; findings below may reference images not displayed]

FINDINGS: The entire left side of the chest was not included on the image.
External pacing pads. Cardiac silhouette enlarged. Diffuse
interstitial and perihilar airspace pulmonary edema bilaterally.
IMPRESSION: Cardiomegaly with interstitial and perihilar airspace pulmonary
edema.
